# Patient Record
Sex: Male | Born: 1975 | Race: White | Hispanic: No | Marital: Married | Smoking: Never smoker
Health system: Southern US, Community
[De-identification: ages and names within clinical notes are randomized; demographics above are authoritative.]

## PROBLEM LIST (undated history)

## (undated) DIAGNOSIS — E7800 Pure hypercholesterolemia, unspecified: Secondary | ICD-10-CM

## (undated) DIAGNOSIS — K219 Gastro-esophageal reflux disease without esophagitis: Secondary | ICD-10-CM

---

## 2011-12-28 ENCOUNTER — Emergency Department (HOSPITAL_BASED_OUTPATIENT_CLINIC_OR_DEPARTMENT_OTHER): Payer: Self-pay | Attending: Emergency Medicine

## 2011-12-28 ENCOUNTER — Emergency Department (HOSPITAL_BASED_OUTPATIENT_CLINIC_OR_DEPARTMENT_OTHER): Payer: Self-pay

## 2011-12-28 ENCOUNTER — Encounter (HOSPITAL_BASED_OUTPATIENT_CLINIC_OR_DEPARTMENT_OTHER): Payer: Self-pay | Admitting: Emergency Medicine

## 2011-12-28 ENCOUNTER — Emergency Department (HOSPITAL_BASED_OUTPATIENT_CLINIC_OR_DEPARTMENT_OTHER)
Admission: EM | Admit: 2011-12-28 | Discharge: 2011-12-28 | Disposition: A | Discharge status: Home / Self Care | Payer: Self-pay | Source: Home / Self Care | Attending: Emergency Medicine | Admitting: Emergency Medicine

## 2011-12-28 DIAGNOSIS — E7800 Pure hypercholesterolemia, unspecified: Secondary | ICD-10-CM | POA: Insufficient documentation

## 2011-12-28 DIAGNOSIS — W1789XA Other fall from one level to another, initial encounter: Secondary | ICD-10-CM | POA: Insufficient documentation

## 2011-12-28 DIAGNOSIS — K219 Gastro-esophageal reflux disease without esophagitis: Secondary | ICD-10-CM | POA: Insufficient documentation

## 2011-12-28 DIAGNOSIS — S51009A Unspecified open wound of unspecified elbow, initial encounter: Secondary | ICD-10-CM | POA: Insufficient documentation

## 2011-12-28 DIAGNOSIS — S51019A Laceration without foreign body of unspecified elbow, initial encounter: Secondary | ICD-10-CM

## 2011-12-28 DIAGNOSIS — Y93H3 Activity, building and construction: Secondary | ICD-10-CM | POA: Insufficient documentation

## 2011-12-28 DIAGNOSIS — W19XXXA Unspecified fall, initial encounter: Secondary | ICD-10-CM

## 2011-12-28 HISTORY — DX: Gastro-esophageal reflux disease without esophagitis: K21.9

## 2011-12-28 HISTORY — DX: Pure hypercholesterolemia, unspecified: E78.00

## 2011-12-28 MED ORDER — LIDOCAINE HCL (PF) 1 % IJ SOLN
5.0000 mL | Freq: Once | INTRAMUSCULAR | Status: AC
  Administered 2011-12-28: 5 mL via INTRADERMAL
  Filled 2011-12-28: qty 5

## 2011-12-28 MED ORDER — CEPHALEXIN 500 MG PO CAPS: 500.0000 mg | ORAL_CAPSULE | Freq: Two times a day (BID) | ORAL | Status: AC

## 2011-12-28 MED ORDER — CEPHALEXIN 250 MG PO CAPS
500.0000 mg | ORAL_CAPSULE | Freq: Once | ORAL | Status: AC
  Administered 2011-12-28: 500 mg via ORAL
  Filled 2011-12-28 (×2): qty 1

## 2011-12-28 NOTE — ED Notes (Signed)
Pt states he fell aproximmately 9 ft. He denied head injuries no loc. Complaints of bilateral knee pain, right elbow pain, aroximately 1" open laceration on posterior right elbow and no active bleeding. MD on bed side for exam.

## 2011-12-28 NOTE — ED Provider Notes (Signed)
History     CSN: [id number]  Arrival date & time 12/28/11  0802   First MD Initiated Contact with Patient 12/28/11 [phone]      Chief Complaint  Patient presents with  . Fall    (Consider location/radiation/quality/duration/timing/severity/associated sxs/prior treatment) HPI The patient presents immediately after a fall.  He is a Corporate investment banker.  He fell from a wall approximately 8 feet high.  He landed predominantly on his right shoulder, and struck his knees and left back.  He denies any loss of consciousness, confusion, visual changes, nausea, vomiting, disorientation. Since the event he said pain focally in the aforementioned areas.  The pain is worse with motion.  No attempts at relief thus far. The patient's last tetanus shot was 5 years ago.  Past Medical History  Diagnosis Date  . Hypercholesteremia   . GERD (gastroesophageal reflux disease)     History reviewed. No pertinent past surgical history.  History reviewed. No pertinent family history.  History  Substance Use Topics  . Smoking status: Never Smoker   . Smokeless tobacco: Not on file  . Alcohol Use: No      Review of Systems  Constitutional:       Per HPI, otherwise negative  HENT:       Per HPI, otherwise negative  Eyes: Negative.   Respiratory:       Per HPI, otherwise negative  Cardiovascular:       Per HPI, otherwise negative  Gastrointestinal: Negative for vomiting.  Genitourinary: Negative.   Musculoskeletal:       Per HPI, otherwise negative  Skin: Negative.   Neurological: Negative for syncope.    Allergies  Alka-seltzer and Aspirin  Home Medications   Current Outpatient Rx  Name Route Sig Dispense Refill  . CEPHALEXIN 500 MG PO CAPS Oral Take 1 capsule (500 mg total) by mouth 2 (two) times daily. 14 capsule 0  . OMEGA-3 FATTY ACIDS 1000 MG PO CAPS Oral Take 2 g by mouth daily.    Marland Kitchen OMEPRAZOLE 20 MG PO CPDR Oral Take 20 mg by mouth daily.      BP 107/71  Pulse 60  Temp  97.8 F (36.6 C) (Oral)  Resp 18  SpO2 100%  Physical Exam  Nursing note and vitals reviewed. Constitutional: He is oriented to person, place, and time. He appears well-developed. No distress.  HENT:  Head: Normocephalic and atraumatic.  Eyes: Conjunctivae and EOM are normal.  Neck: Full passive range of motion without pain. No spinous process tenderness and no muscular tenderness present.  Cardiovascular: Normal rate and regular rhythm.   Pulmonary/Chest: Effort normal. No stridor. No respiratory distress.  Abdominal: He exhibits no distension.  Musculoskeletal: He exhibits no edema.       Back:       Arms:      Legs: Neurological: He is alert and oriented to person, place, and time.  Skin: Skin is warm and dry.  Psychiatric: He has a normal mood and affect.    ED Course  LACERATION REPAIR Date/Time: 12/28/2011 9:30 AM Performed by: Gerhard Munch Authorized by: Gerhard Munch Consent: Verbal consent obtained. Written consent not obtained. The procedure was performed in an emergent situation. Risks and benefits: risks, benefits and alternatives were discussed Consent given by: patient Patient identity confirmed: verbally with patient Time out: Immediately prior to procedure a "time out" was called to verify the correct patient, procedure, equipment, support staff and site/side marked as required. Body area: upper extremity Location details:  right elbow Laceration length: 7 cm Tendon involvement: none Nerve involvement: none Vascular damage: no Anesthesia: local infiltration Local anesthetic: lidocaine 1% with epinephrine Anesthetic total: 5 ml Patient sedated: no Preparation: Patient was prepped and draped in the usual sterile fashion. Irrigation solution: saline Irrigation method: syringe Amount of cleaning: standard Debridement: none Skin closure: 4-0 nylon Number of sutures: 4 Technique: simple Approximation: close Approximation difficulty:  simple Dressing: 4x4 sterile gauze Patient tolerance: Patient tolerated the procedure well with no immediate complications.   (including critical care time)  Labs Reviewed - No data to display Dg Lumbar Spine Complete  12/28/2011  *RADIOLOGY REPORT*  Clinical Data: Left-sided low back pain.  LUMBAR SPINE - COMPLETE 4+ VIEW  Comparison: None.  Findings: Five lumbar type vertebral bodies.  Vertebral body height preserved.  The alignment is anatomic.  L4-L5 and L5-S1 spondylosis is present with loss of disc height and small marginal osteophytes. Mild degenerative disc disease at L2-L3 and L3-L4. There are no pars defects identified.  IMPRESSION: Mild lumbar spondylosis.  No acute abnormality.   Original Report Authenticated By: Andreas Newport, M.D.    Dg Clavicle Right  12/28/2011  *RADIOLOGY REPORT*  Clinical Data: Fall with pain.  RIGHT CLAVICLE - 2+ VIEWS  Comparison: None.  Findings: No fracture or dislocation.  Mild degenerative changes in the right acromioclavicular joint.  Visualized portion of the right chest is unremarkable.  IMPRESSION: No acute findings.  Minimal right acromioclavicular joint osteoarthritis.   Original Report Authenticated By: Reyes Ivan, M.D.    Dg Knee Complete 4 Views Left  12/28/2011  *RADIOLOGY REPORT*  Clinical Data: Fall with pain.  LEFT KNEE - COMPLETE 4+ VIEW  Comparison: None.  Findings: No joint effusion or fracture.  Minimal osteophytosis in the medial and patellofemoral compartments.  Probable faint chondrocalcinosis in the medial and lateral compartments.  IMPRESSION:  1.  No acute findings. 2.  Minimal degenerative change.   Original Report Authenticated By: Reyes Ivan, M.D.      1. Fall   2. Laceration of elbow       MDM  This generally well male presents after a mechanical fall.  The denial of any loss of consciousness, neurologic complaints or neurologic findings his reassuring for the low suspicion of intracranial pathology.  The  patient's multiple areas of pain were evaluated with x-ray, which were negative.  The patient did have a laceration that required repair.  The patient tolerated this well, was discharged in stable condition with antibiotics to to the depth of the laceration.   Gerhard Munch, MD 12/28/11 1001

## 2012-01-03 ENCOUNTER — Encounter (HOSPITAL_BASED_OUTPATIENT_CLINIC_OR_DEPARTMENT_OTHER): Payer: Self-pay | Admitting: *Deleted

## 2012-01-03 ENCOUNTER — Emergency Department (HOSPITAL_BASED_OUTPATIENT_CLINIC_OR_DEPARTMENT_OTHER)
Admission: EM | Admit: 2012-01-03 | Discharge: 2012-01-03 | Disposition: A | Discharge status: Home / Self Care | Payer: Self-pay | Attending: Emergency Medicine | Admitting: Emergency Medicine

## 2012-01-03 DIAGNOSIS — E7800 Pure hypercholesterolemia, unspecified: Secondary | ICD-10-CM | POA: Insufficient documentation

## 2012-01-03 DIAGNOSIS — Z4802 Encounter for removal of sutures: Secondary | ICD-10-CM | POA: Insufficient documentation

## 2012-01-03 DIAGNOSIS — K219 Gastro-esophageal reflux disease without esophagitis: Secondary | ICD-10-CM | POA: Insufficient documentation

## 2012-01-03 NOTE — ED Notes (Signed)
Sutures x 4 right elbow. Well healed. No complaints.

## 2012-01-03 NOTE — ED Provider Notes (Signed)
History     CSN: [id number]  Arrival date & time 01/03/12  1122   First MD Initiated Contact with Patient 01/03/12 1148      Chief Complaint  Patient presents with  . Suture / Staple Removal    (Consider location/radiation/quality/duration/timing/severity/associated sxs/prior treatment) HPI Patient is a 47 neural male status post suture placement for right elbow laceration on 8/28.  Patient has been well with no discharge from the wound. Patient is here for suture removal today. He has no complaints and no pain. Past Medical History  Diagnosis Date  . Hypercholesteremia   . GERD (gastroesophageal reflux disease)     History reviewed. No pertinent past surgical history.  History reviewed. No pertinent family history.  History  Substance Use Topics  . Smoking status: Never Smoker   . Smokeless tobacco: Not on file  . Alcohol Use: No      Review of Systems  Skin: Positive for wound.       Well-healed laceration.  All other systems reviewed and are negative.    Allergies  Alka-seltzer and Aspirin  Home Medications   Current Outpatient Rx  Name Route Sig Dispense Refill  . CEPHALEXIN 500 MG PO CAPS Oral Take 1 capsule (500 mg total) by mouth 2 (two) times daily. 14 capsule 0  . OMEGA-3 FATTY ACIDS 1000 MG PO CAPS Oral Take 2 g by mouth daily.    Marland Kitchen OMEPRAZOLE 20 MG PO CPDR Oral Take 20 mg by mouth daily.      BP 121/75  Pulse 64  Temp 98.1 F (36.7 C) (Oral)  Resp 20  SpO2 100%  Physical Exam  Nursing note and vitals reviewed. GEN: Well-developed, well-nourished male in no distress HEENT: Atraumatic, normocephalic.  EYES: PERRLA BL, no scleral icterus. NECK: Trachea midline, no meningismus Neuro: cranial nerves grossly 2-12 intact, no abnormalities of strength or sensation, A and O x 3 MSK: Patient moves all 4 extremities symmetrically, no deformity, edema, or injury noted Skin: Well-healed right elbow laceration with 4 Sutures in place. No discharge  noted. Psych: no abnormality of mood   ED Course  Procedures (including critical care time)  SUTURE REMOVAL Performed by: Cyndra Numbers  Consent: Verbal consent obtained. Patient identity confirmed: provided demographic data Time out: Immediately prior to procedure a "time out" was called to verify the correct patient, procedure, equipment, support staff and site/side marked as required.  Location details: Right elbow  Wound Appearance: clean  Sutures/Staples Removed: For simple interrupted   Facility: sutures placed in this facility Patient tolerance: Patient tolerated the procedure well with no immediate complications.    Labs Reviewed - No data to display No results found.   1. Visit for suture removal       MDM  Patient was seen today with no complications from prior wound repair. Sutures were removed. Patient was discharged in good condition.        Cyndra Numbers, MD 01/03/12 [phone]

## 2013-08-24 ENCOUNTER — Ambulatory Visit: Payer: Self-pay | Admitting: Family Medicine

## 2013-08-24 ENCOUNTER — Ambulatory Visit: Payer: Self-pay

## 2013-08-24 VITALS — BP 148/90 | HR 94 | Temp 98.1°F | Resp 18 | Ht 65.0 in | Wt 170.6 lb

## 2013-08-24 DIAGNOSIS — M549 Dorsalgia, unspecified: Secondary | ICD-10-CM

## 2013-08-24 DIAGNOSIS — R0789 Other chest pain: Secondary | ICD-10-CM

## 2013-08-24 DIAGNOSIS — M533 Sacrococcygeal disorders, not elsewhere classified: Secondary | ICD-10-CM

## 2013-08-24 DIAGNOSIS — S300XXA Contusion of lower back and pelvis, initial encounter: Secondary | ICD-10-CM

## 2013-08-24 DIAGNOSIS — R071 Chest pain on breathing: Secondary | ICD-10-CM

## 2013-08-24 DIAGNOSIS — S20229A Contusion of unspecified back wall of thorax, initial encounter: Secondary | ICD-10-CM

## 2013-08-24 MED ORDER — MELOXICAM 7.5 MG PO TABS: 7.5000 mg | ORAL_TABLET | Freq: Every day | ORAL | Status: DC

## 2013-08-24 MED ORDER — CYCLOBENZAPRINE HCL 5 MG PO TABS: ORAL_TABLET | ORAL | Status: DC

## 2013-08-24 NOTE — Progress Notes (Addendum)
Subjective:    Patient ID: Marcus Yates, male    DOB: [birth year], 38 y.o.   MRN: [id number]  Back Pain Associated symptoms include chest pain ( TTP) and numbness ( right foot).   Chief Complaint  Patient presents with  . Back Pain    fell off a ladder yesterday at 3pm    This chart was scribed for Meredith Staggers, MD by Andrew Au, ED Scribe. This patient was seen in room 4 and the patient's care was started at 3:49 PM.  HPI Comments: Marcus Yates is a 38 y.o. male who presents to the [facility] complaining of a fall onset one  day. Pt reports that he fell off a ladder while at work and is now complaining of right lower back pain. Pt states that he reported the incident to his employer. 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 Pt reports that he fell 12 ft down and landed on concrete flat on his back while wearing a hard hat. Pt reports that he was wearing a tool belt and a hammer protruded into lower back. Pt reports that during the fall he tried to catch himself and is now complaining of pain to chest. After fall pt did not feel pain initially but began feeling pain last night. Pt reports numbness in right foot after fall but denies feeling numbness at this moment. He also reports bruise to back of head. Pt denies bladder incontinence. lower number in right foot but doesn not have numbness currtent He reports bruise .pt reports pain in the front of   History reviewed. No pertinent past medical history. No Known Allergies Prior to Admission medications   Not on File    Review of Systems  Cardiovascular: Positive for chest pain ( TTP).  Genitourinary: Negative for difficulty urinating.  Musculoskeletal: Positive for back pain.  Neurological: Positive for numbness ( right foot).       Objective:   Physical Exam  Constitutional: He appears well-developed and well-nourished. No distress.  Neck:  Neck- c-spine full ROM no bony tendernes    Musculoskeletal:  Chest wall- tenderness to left Gardiner joint to proximal clavicle no apparent deformity proximal 3rd of clavicle to pectoralis muscle. Shoulder- Superficial abrasion to left posterior shoulder no other wounds noted Full ROM at shoulders. t spine-nontender Tenderness to L4 Ll5 midline bony tenderness to SI joint.  Tender to lower sacrum   Neurological: He displays no Babinski's sign on the right side. He displays no Babinski's sign on the left side.  Reflex Scores:      Patellar reflexes are 2+ on the right side and 2+ on the left side.      Achilles reflexes are 2+ on the right side and 2+ on the left side. Able to heel toe walk guarded flexion to about 80 degrees Extension intact Pain with right lateral flexion Intact left lateral flexion Rotation intact     [facility] reading (PRIMARY) by  Dr.[place] Kretz - LS spine, coccyx.: min anterior spurring lower lumbar, suspected coccyx fx.  L clavicle: no apparent fx.      Assessment & Plan:   [name] is a 38 y.o. male Back pain - Plan: DG Clavicle Left, DG Lumbar Spine 2-3 Views, DG Sacrum/Coccyx, meloxicam (MOBIC) 7.5 MG tablet, cyclobenzaprine (FLEXERIL) 5 MG tablet, Coccydynia - Plan: meloxicam (MOBIC) 7.5 MG tablet  -lumbar contusion with possible coccygeal injury/fx.  Sx care, flexeril Q8h prn, mobic qd prn. Recheck in  5 days.   Left-sided chest wall pain - Plan: meloxicam (MOBIC) 7.5 MG tablet  -strain with catching self on fall. Slight [place] ttp without sts, or crepitance. Sx care, avoid lifting, and recehck in 5days. May need MR or CT for further eval. Rtc/er precautions.  Contusion of lower back - Plan: meloxicam (MOBIC) 7.5 MG tablet, cyclobenzaprine (FLEXERIL) 5 MG tablet - as above.   Language barrier - interpreter present. Understanding expressed.    Meds ordered this encounter  Medications  . meloxicam (MOBIC) 7.5 MG tablet    Sig: Take 1 tablet (7.5 mg total) by mouth daily.    Dispense:  30 tablet     Refill:  0  . cyclobenzaprine (FLEXERIL) 5 MG tablet    Sig: 1 pill by mouth up to every 8 hours as needed. Start with one pill by mouth each bedtime as needed due to sedation    Dispense:  15 tablet    Refill:  0   Patient Instructions  Out of work until recheck in 5 days would be best. Ok to move gently throughout the day (do not stay in bed as this makes spasm worse).   mobic - 1-2 per day as needed for pain (do not combine with other over the counter pain relievers), flexeril up to every 8 hours for muscle spasm. Ice or heat to affected area as needed.  Sit on cushioned ring or other cushion to lessen pain at tailbone. Can recheck back and collarbone area in 5 days - may need other tests or orthopaedic doctor referral.  Return to the clinic or go to the nearest emergency room if any of your symptoms worsen or new symptoms occur.  Dolor de espalda en el adulto (Back Pain, Adult)  El dolor de cintura es frecuente. Aproximadamente 1 de cada 5 personas lo sufren.La causa rara vez pone en peligro la vida. Con frecuencia mejora luego de algn tiempo.Alrededor de la mitad de las personas que sufren un inicio sbito de dolor de cintura, se sentirn mejor luego de 2 semanas. Aproximadamente 8 de cada 10 se sentirn mejor luego de 6 semanas.  CAUSAS  Algunas causas comunes son:   Distensin de los msculos o ligamentos que sostienen la columna vertebral.  Desgaste (degeneracin) de los discos vertebrales.  Artritis.  Traumatismos directos en la espalda. DIAGNSTICO  La mayor parte de las veces, la causa directa no se conoce.Sin embargo, Chief Technology Officerel dolor puede tratarse efectivamente an cuando no se Best boyconozca la causa.Una de las formas ms precisas de asegurar que la causa del dolor no constituye un peligro es responder a las preguntas del mdico acerca de su salud y sus sntomas. Si el mdico necesita ms informacin, podr indicar anlisis de laboratorio o Education officer, environmentalrealizar un diagnstico por imgenes  (radiografas o Health visitorresonancia magntica).Sin embargo, aunque las Hovnanian Enterprisesimgenes muestren modificaciones, generalmente no es necesaria la [place].  INSTRUCCIONES PARA EL CUIDADO EN EL HOGAR  En algunas personas, el dolor de espalda vuelve.Como rara vez es peligroso, los pacientes pueden aprender a Interior and spatial designermanejarlo ellos mismos.   Mantngase activo. Si permanece sentado o de pie mucho tiempo en el mismo lugar, se tensiona la espalda.  No se siente, maneje ni se quede parado en un mismo lugar por ms de 30 minutos. Realice caminatas cortas en superficies planas ni bien el dolor haya cedido. Trate de Copyaumentar cada da el tiempo que camina .  No se quede en la cama.Si hace reposo durante ms de 1 o 2 das, puede Estate agentretrasar la recuperacin.  No evite los ejercicios ni el trabajo.El cuerpo est hecho para moverse.No es peligroso estar [place], aunque le duela la espalda.La espalda se curar ms rpido si contina sus actividades antes de que el dolor se vaya.  Preste atencin a su cuerpo cuando se incline y se levante. Muchas personas sienten menos molestias cuando levantan objetos si doblan las rodillas, mantienen la carga cerca del cuerpo y evitan torcerse. Generalmente, las posiciones ms cmodas son las que ejercen menos tensin en la espalda en recuperacin.  Encuentre una posicin cmoda para dormir. Utilice un colchn firme y recustese de [place]. Doble ligeramente sus rodillas. Si se recuesta sobre su espalda, coloque una almohada debajo de sus rodillas.  Tome slo medicamentos de venta libre o recetados, segn las indicaciones del mdico. Los medicamentos de venta libre para Primary school teacher y reducir Futures trader, son los que en general ms ayudan.El mdico podr prescribirle relajantes musculares.Estos medicamentos calman el dolor de modo que pueda retornar a sus actividades normales y a Investment banker, operational.  Aplique hielo sobre la zona lesionada.  Ponga el hielo en una bolsa  plstica.  Colquese una toalla entre la piel y la bolsa de hielo.  Deje la bolsa de hielo durante 15 a 20 minutos 3 a 4 veces por da, durante los primeros 2  3 das. Luego podr alternar Eusebio Me calor y hielo para reducir Chief Technology Officer y los espasmos.  Consulte a su mdico si puede tratar de hacer ejercicios para la espalda y recibir un masaje suave. Pueden ser beneficiosos.  Evite sentirse ansioso o estresado.El estrs aumenta la tensin muscular y puede empeorar el dolor de espalda.Es importante reconocer cuando est ansioso o estresado y aprender la forma de controlarlos.El ejercicio es una gran opcin. SOLICITE ATENCIN MDICA SI:   Siente un dolor que no se alivia con reposo o medicamentos.  El dolor no mejora en 1 semana.  Desarrolla nuevos sntomas.  No se siente bien en general. SOLICITE ATENCIN MDICA DE INMEDIATO SI:  Siente un dolor que se irradia desde la espalda hacia sus piernas.  Desarrolla nuevos problemas en el intestino o la vejiga.  Siente debilidad o adormecimiento inusual en sus brazos o piernas.  Presenta nuseas o vmitos.  Presenta dolor abdominal.  Se siente desfalleciente. Document Released: 04/18/2005 Document Revised: 10/18/2011 [facility] Patient Information 2014 [place], [place]. Dolor de la pared torcica (Chest Wall Pain) Dolor en la pared torcica es dolor en o alrededor de los huesos y msculos de su pecho. Podrn pasar hasta 6 semanas hasta que comience a mejorar. Puede demorar ms tiempo si es fsicamente activo en su Aleen Campi y [place].  CAUSAS  El dolor en el pecho puede aparecer sin motivo. No obstante, algunas causas pueden ser:   Neomia Dear enfermedad viral como la gripe.  Traumatismos.  Tos.  La prctica de ejercicios.  Artritis.  Fibromialgia  [place]. INSTRUCCIONES PARA EL CUIDADO DOMICILIARIO  Evite hacer actividad fsica extenuante. Trate de no esforzarse o Electrical engineer. Aqu se incluyen las  actividades en las que [place] los msculos del trax, los abdominales y los msculos laterales, especialmente si debe levantar objetos pesados.  Aplique hielo sobre la zona dolorida.  Ponga el hielo en una bolsa plstica.  Colquese una toalla entre la piel y la bolsa de hielo.  Deje la bolsa de hielo durante 15 a 20 minutos por hora, durante los primeros 2 [address]  [address].  Utilice los medicamentos de venta libre o de prescripcin para Chief Technology Officer, Environmental health practitioner o la  fiebre, segn se lo indique el profesional que lo asiste. SOLICITE ATENCIN MDICA DE INMEDIATO SI:  El dolor aumenta o siente muchas molestias.  Tiene fiebre.  El dolor de [place] Gatepecho empeora.  Desarrolla nuevos e inexplicables sntomas.  Tiene nuseas o vmitos.  Berenice Primasranspira o se siente mareado.  Tiene tos con flema (esputo), o tose con sangre. EST SEGURO QUE:   Comprende las instrucciones para el alta mdica.  Controlar su enfermedad.  Solicitar atencin mdica de inmediato segn las indicaciones. Document Released: 05/30/2006 Document Revised: 07/11/2011 [facility] Patient Information 2014 [place], [place]. Traumatismo del cxis  (Tailbone Injury)  El cxis es el hueso pequeo que se encuentra en el extremo inferior de la columna vertebral. Una lesin en el cxis puede implicar la distensin de los tejidos, hematomas o un hueso roto (fracture). Las mujeres son ms vulnerables debido a que su pelvis es ms ancha.  CAUSAS  Este tipo de lesin generalmente se produce al caer sobre coxis. La tensin o la friccin repetida por Cablevision Systemsactividades como el remo y el ciclismo tambin puede lesionar la zona. El cccix puede lesionarse durante el Falcon Lake Estatesparto. Las infecciones o tumores tambin pueden ejercer presin en el coxis y Programmer, multimediacausar dolor. A veces, la causa es desconocida.  SNTOMAS   Hematomas.  Dolor al sentarse.  Movimientos intestinales dolorosos.  En las mujeres, dolor durante las The St. Paul Travelersrelaciones sexuales. DIAGNSTICO  El mdico podr hacer el  diagnstico basndose en los sntomas y el examen fsico Podrn tomarle una radiografa si se sospecha una fractura. Tambin podr indicarle una resonancia magntica para evaluar los sntomas.  TRATAMIENTO  El mdico podr prescribir algunos medicamentos para Primary school teachercalmar el dolor. La mayora de las lesiones en el coxis mejoran por s mismas despus de 4 a 6 semanas. Sin embargo, si la causa es una infeccin o un tumor, el tiempo de Paediatric nurserecuperacin podr variar.  PREVENCIN  Use protectores y equipo deportivo adecuados , cuando practique ciclismo y remo. Esto puede ayudar a prevenir una lesin por tensin repetida o friccin.  INSTRUCCIONES PARA EL CUIDADO DOMICILIARIO  Aplique hielo sobre la zona lesionada.  Ponga el hielo en una bolsa plstica.  Colquese una toalla entre la piel y la bolsa de hielo.  Deje la bolsa de hielo durante 15 a 20minutos 3 a 4 veces por da, durante los primeros 1  2 das.  Si se sienta sobre un aro de goma o inflable o sobre un almohadn podr Engineer, materialsaliviar el dolor. Inclnese hacia adelante al sentarse para disminuir las Federal-Mogulmolestias.  Evite estar sentado durante largos perodos.  Aumente la actividad cuando el dolor se lo permita.  Utilice los medicamentos de venta libre o de prescripcin para Chief Technology Officerel dolor, Environmental health practitionerel malestar o la [place] Hillfiebre, segn se lo indique el profesional que lo asiste.  Puede usar laxantes si siente dolor al mover el intestino, o segn las indicaciones del mdico.  Consuma una dieta rica en fibra para prevenir la constipacin.  Cumpla con todas las visitas de control, segn le indique su mdico. SOLICITE ATENCIN MDICA SI:  El dolor empeora.  Al mover el intestino siente una molestia intensa.  No puede mover el intestino.  Tiene fiebre. ASEGRESE DE QUE:   Comprende estas instrucciones.  Controlar su enfermedad.  Solicitar ayuda de inmediato si no mejora o si empeora. Document Released: 01/26/2005 Document Revised: 07/11/2011 [facility] Patient  Information 2014 [place]ExitCare, [place]LL[place].       I personally performed the services described in this documentation, which was scribed in my presence. The recorded information  has been reviewed and considered, and addended by me as needed.

## 2013-08-24 NOTE — Patient Instructions (Addendum)
Out of work until recheck in 5 days would be best. Ok to move gently throughout the day (do not stay in bed as this makes spasm worse).   mobic - 1-2 per day as needed for pain (do not combine with other over the counter pain relievers), flexeril up to every 8 hours for muscle spasm. Ice or heat to affected area as needed.  Sit on cushioned ring or other cushion to lessen pain at tailbone. Can recheck back and collarbone area in 5 days - may need other tests or orthopaedic doctor referral.  Return to the clinic or go to the nearest emergency room if any of your symptoms worsen or new symptoms occur.  Dolor de espalda en el adulto (Back Pain, Adult)  El dolor de cintura es frecuente. Aproximadamente 1 de cada 5 personas lo sufren.La causa rara vez pone en peligro la vida. Con frecuencia mejora luego de algn tiempo.Alrededor de la mitad de las personas que sufren un inicio sbito de dolor de cintura, se sentirn mejor luego de 2 semanas. Aproximadamente 8 de cada 10 se sentirn mejor luego de 6 semanas.  CAUSAS  Algunas causas comunes son:   Distensin de los msculos o ligamentos que sostienen la columna vertebral.  Desgaste (degeneracin) de los discos vertebrales.  Artritis.  Traumatismos directos en la espalda. DIAGNSTICO  La mayor parte de las veces, la causa directa no se conoce.Sin embargo, Chief Technology Officer puede tratarse efectivamente an cuando no se Best boy.Una de las formas ms precisas de asegurar que la causa del dolor no constituye un peligro es responder a las preguntas del mdico acerca de su salud y sus sntomas. Si el mdico necesita ms informacin, podr indicar anlisis de laboratorio o Education officer, environmental un diagnstico por imgenes (radiografas o Health visitor).Sin embargo, aunque las [employer] modificaciones, generalmente no es necesaria la [place].  INSTRUCCIONES PARA EL CUIDADO EN EL HOGAR  En algunas personas, el dolor de espalda vuelve.Como rara vez es  peligroso, los pacientes pueden aprender a Interior and spatial designer.   Mantngase activo. Si permanece sentado o de pie mucho tiempo en el mismo lugar, se tensiona la espalda.  No se siente, maneje ni se quede parado en un mismo lugar por ms de 30 minutos. Realice caminatas cortas en superficies planas ni bien el dolor haya cedido. Trate de Copy tiempo que camina .  No se quede en la cama.Si hace reposo durante ms de 1 o 2 das, puede Estate agent.  No evite los ejercicios ni el trabajo.El cuerpo est hecho para moverse.No es peligroso estar [place], aunque le duela la espalda.La espalda se curar ms rpido si contina sus actividades antes de que el dolor se vaya.  Preste atencin a su cuerpo cuando se incline y se levante. Muchas personas sienten menos molestias cuando levantan objetos si doblan las rodillas, mantienen la carga cerca del cuerpo y evitan torcerse. Generalmente, las posiciones ms cmodas son las que ejercen menos tensin en la espalda en recuperacin.  Encuentre una posicin cmoda para dormir. Utilice un colchn firme y recustese de [place]. Doble ligeramente sus rodillas. Si se recuesta sobre su espalda, coloque una almohada debajo de sus rodillas.  Tome slo medicamentos de venta libre o recetados, segn las indicaciones del mdico. Los medicamentos de venta libre para Primary school teacher y reducir Futures trader, son los que en general ms ayudan.El mdico podr prescribirle relajantes musculares.Estos medicamentos calman el dolor de modo que pueda retornar a sus actividades normales y  a realizar ejercicios saludables.  Aplique hielo sobre la zona lesionada.  Ponga el hielo en una bolsa plstica.  Colquese una toalla entre la piel y la bolsa de hielo.  Deje la bolsa de hielo durante 15 a 20 minutos 3 a 4 veces por da, durante los primeros 2  3 das. Luego podr alternar Eusebio Meentre calor y hielo para reducir Chief Technology Officerel dolor y los espasmos.  Consulte a  su mdico si puede tratar de hacer ejercicios para la espalda y recibir un masaje suave. Pueden ser beneficiosos.  Evite sentirse ansioso o estresado.El estrs aumenta la tensin muscular y puede empeorar el dolor de espalda.Es importante reconocer cuando est ansioso o estresado y aprender la forma de controlarlos.El ejercicio es una gran opcin. SOLICITE ATENCIN MDICA SI:   Siente un dolor que no se alivia con reposo o medicamentos.  El dolor no mejora en 1 semana.  Desarrolla nuevos sntomas.  No se siente bien en general. SOLICITE ATENCIN MDICA DE INMEDIATO SI:  Siente un dolor que se irradia desde la espalda hacia sus piernas.  Desarrolla nuevos problemas en el intestino o la vejiga.  Siente debilidad o adormecimiento inusual en sus brazos o piernas.  Presenta nuseas o vmitos.  Presenta dolor abdominal.  Se siente desfalleciente. Document Released: 04/18/2005 Document Revised: 10/18/2011 [facility] Patient Information 2014 [place]amExitCare, MarylandLLC. Dolor de la pared torcica (Chest Wall Pain) Dolor en la pared torcica es dolor en o alrededor de los huesos y msculos de su pecho. Podrn pasar hasta 6 semanas hasta que comience a mejorar. Puede demorar ms tiempo si es fsicamente activo en su Aleen Campitrabajo y Brandywineactividades.  CAUSAS  El dolor en el pecho puede aparecer sin motivo. No obstante, algunas causas pueden ser:   Neomia DearUna enfermedad viral como la gripe.  Traumatismos.  Tos.  La prctica de ejercicios.  Artritis.  Fibromialgia  [place]. INSTRUCCIONES PARA EL CUIDADO DOMICILIARIO  Evite hacer actividad fsica extenuante. Trate de no esforzarse o Electrical engineerrealizar actividades que le causen dolor. Aqu se incluyen las actividades en las que [place]ausa los msculos del trax, los abdominales y los msculos laterales, especialmente si debe levantar objetos pesados.  Aplique hielo sobre la zona dolorida.  Ponga el hielo en una bolsa plstica.  Colquese una toalla entre la piel y la  bolsa de hielo.  Deje la bolsa de hielo durante 15 a 20 minutos por hora, durante los primeros 2 [address]  [address].  Utilice los medicamentos de venta libre o de prescripcin para Chief Technology Officerel dolor, Environmental health practitionerel malestar o la [place] Olivefiebre, segn se lo indique el profesional que lo asiste. SOLICITE ATENCIN MDICA DE INMEDIATO SI:  El dolor aumenta o siente muchas molestias.  Tiene fiebre.  El dolor de Rock Islandpecho empeora.  Desarrolla nuevos e inexplicables sntomas.  Tiene nuseas o vmitos.  Berenice Primasranspira o se siente mareado.  Tiene tos con flema (esputo), o tose con sangre. EST SEGURO QUE:   Comprende las instrucciones para el alta mdica.  Controlar su enfermedad.  Solicitar atencin mdica de inmediato segn las indicaciones. Document Released: 05/30/2006 Document Revised: 07/11/2011 [facility] CExitCare Patient Information 2014 [place]orgetownExitCare, MarylandLLC. Traumatismo del cxis  (Tailbone Injury)  El cxis es el hueso pequeo que se encuentra en el extremo inferior de la columna vertebral. Una lesin en el cxis puede implicar la distensin de los tejidos, hematomas o un hueso roto (fracture). Las mujeres son ms vulnerables debido a que su pelvis es ms ancha.  CAUSAS  Este tipo de lesin generalmente se produce al caer sobre coxis. La tensin o la friccin  repetida por Cablevision Systemsactividades como el remo y el ciclismo tambin puede lesionar la zona. El cccix puede lesionarse durante el [place]parto. Las infecciones o tumores tambin pueden ejercer presin en el coxis y Programmer, multimediacausar dolor. A veces, la causa es desconocida.  SNTOMAS   Hematomas.  Dolor al sentarse.  Movimientos intestinales dolorosos.  En las mujeres, dolor durante las The St. Paul Travelersrelaciones sexuales. DIAGNSTICO  El mdico podr hacer el diagnstico basndose en los sntomas y el examen fsico Podrn tomarle una radiografa si se sospecha una fractura. Tambin podr indicarle una resonancia magntica para evaluar los sntomas.  TRATAMIENTO  El mdico podr prescribir algunos medicamentos para Software engineercalmar  el dolor. La mayora de las lesiones en el coxis mejoran por s mismas despus de 4 a 6 semanas. Sin embargo, si la causa es una infeccin o un tumor, el tiempo de Paediatric nurserecuperacin podr variar.  PREVENCIN  Use protectores y equipo deportivo adecuados , cuando practique ciclismo y remo. Esto puede ayudar a prevenir una lesin por tensin repetida o friccin.  INSTRUCCIONES PARA EL CUIDADO DOMICILIARIO  Aplique hielo sobre la zona lesionada.  Ponga el hielo en una bolsa plstica.  Colquese una toalla entre la piel y la bolsa de hielo.  Deje la bolsa de hielo durante 15 a 20minutos 3 a 4 veces por da, durante los primeros 1  2 das.  Si se sienta sobre un aro de goma o inflable o sobre un almohadn podr Engineer, materialsaliviar el dolor. Inclnese hacia adelante al sentarse para disminuir las Federal-Mogulmolestias.  Evite estar sentado durante largos perodos.  Aumente la actividad cuando el dolor se lo permita.  Utilice los medicamentos de venta libre o de prescripcin para Chief Technology Officerel dolor, Environmental health practitionerel malestar o la St. Liboryfiebre, segn se lo indique el profesional que lo asiste.  Puede usar laxantes si siente dolor al mover el intestino, o segn las indicaciones del mdico.  Consuma una dieta rica en fibra para prevenir la constipacin.  Cumpla con todas las visitas de control, segn le indique su mdico. SOLICITE ATENCIN MDICA SI:  El dolor empeora.  Al mover el intestino siente una molestia intensa.  No puede mover el intestino.  Tiene fiebre. ASEGRESE DE QUE:   Comprende estas instrucciones.  Controlar su enfermedad.  Solicitar ayuda de inmediato si no mejora o si empeora. Document Released: 01/26/2005 Document Revised: 07/11/2011 [facility]ExitCare Patient Information 2014 [place] PaulExitCare, MarylandLLC.

## 2013-08-29 ENCOUNTER — Ambulatory Visit: Payer: Self-pay | Admitting: Family Medicine

## 2013-08-29 VITALS — BP 110/70 | HR 63 | Temp 98.4°F | Resp 16 | Ht 65.0 in | Wt 170.0 lb

## 2013-08-29 DIAGNOSIS — R0789 Other chest pain: Secondary | ICD-10-CM

## 2013-08-29 DIAGNOSIS — S300XXA Contusion of lower back and pelvis, initial encounter: Secondary | ICD-10-CM

## 2013-08-29 DIAGNOSIS — M549 Dorsalgia, unspecified: Secondary | ICD-10-CM

## 2013-08-29 DIAGNOSIS — M533 Sacrococcygeal disorders, not elsewhere classified: Secondary | ICD-10-CM

## 2013-08-29 DIAGNOSIS — R071 Chest pain on breathing: Secondary | ICD-10-CM

## 2013-08-29 DIAGNOSIS — R319 Hematuria, unspecified: Secondary | ICD-10-CM

## 2013-08-29 DIAGNOSIS — S20229A Contusion of unspecified back wall of thorax, initial encounter: Secondary | ICD-10-CM

## 2013-08-29 LAB — POCT URINALYSIS DIPSTICK
Bilirubin, UA: NEGATIVE
Blood, UA: NEGATIVE
GLUCOSE UA: NEGATIVE
LEUKOCYTES UA: NEGATIVE
NITRITE UA: NEGATIVE
Spec Grav, UA: >=1.03
UROBILINOGEN UA: 0.2
pH, UA: 6

## 2013-08-29 LAB — POCT UA - MICROSCOPIC ONLY
Bacteria, U Microscopic: NEGATIVE
CASTS, UR, LPF, POC: NEGATIVE
Crystals, Ur, HPF, POC: NEGATIVE
YEAST UA: NEGATIVE

## 2013-08-29 MED ORDER — CYCLOBENZAPRINE HCL 5 MG PO TABS: ORAL_TABLET | ORAL | Status: AC

## 2013-08-29 MED ORDER — MELOXICAM 7.5 MG PO TABS: 7.5000 mg | ORAL_TABLET | Freq: Every day | ORAL | Status: DC

## 2013-08-29 NOTE — Patient Instructions (Addendum)
regrese a Passenger transport managertrabajo, pero menos de 10 libros, y minimo trabaja con su espalda. si sangre regrese en su orina, regrese a clinica o cuarto de Associate Professoremergencia.   chequiar en 1 semana. Mas temprano si peorse.  Contusin  (Contusion)  Una contusin es un hematoma interno. Las contusiones son el resultado de un traumatismo que produce un sangrado debajo de la piel. La contusin Clear Channel Communicationspuede volverse azul, prpura o [place] Havenamarilla. Un traumatismo menor ocasionar un hematoma indoloro, pero las contusiones ms importantes pueden doler y Personal assistantpermanecer hinchadas durante varias semanas.  CAUSAS  Generalmente la causa de la contusin es un golpe, un traumatismo o una fuerza directa ejercida en una zona del cuerpo.  SNTOMAS   Hinchazn y enrojecimiento en la zona lesionada.  Hematoma en la zona lesionada.  Sensibilidad e inflamacin en la zona lesionada.  Dolor. DIAGNSTICO  El diagnstico puede hacerse a travs de la historia clnica y el examen fsico. Ser necesaria una radiografa o una tomografa computada para determinar si hay lesiones asociadas, como fracturas.  TRATAMIENTO  El tratamiento especfico depender de qu parte del cuerpo se lesion. En general, el mejor tratamiento para una contusin es el reposo, hielo, elevacin, y la aplicacin de compresas fras en el rea afectada. Tambin se recomiendan los medicamentos de venta libre para el control del dolor. Pregunte a su mdico cul es el mejor tratamiento para su contusin.  INSTRUCCIONES PARA EL CUIDADO EN EL HOGAR   Aplique hielo sobre la zona lesionada.  Ponga el hielo en una bolsa plstica.  Colquese una toalla entre la piel y la bolsa de hielo.  Deje el hielo durante 15 a 20 minutos, 3 a 4 veces por da.  Slo tome medicamentos de venta libre o recetados para Primary school teachercalmar el dolor, las molestias o bajar la fiebre segn las indicaciones de su mdico. El mdico puede indicarle que evite los antiinflamatorios (aspirina, ibuprofeno y naproxeno) durante 48  horas debido a que estos medicamentos pueden aumentar el hematoma.  Haga que la zona lesionada repose.  En lo posible, eleve la zona lesionada para disminuir la hinchazn. SOLICITE ATENCIN MDICA DE INMEDIATO SI:   El hematoma o la hinchazn aumentan.  Siente que Community education officerel dolor empeora.  El dolor o la hinchazn no se alivian con los medicamentos. ASEGRESE DE QUE:   Comprende estas instrucciones.  Controlar su enfermedad.  Solicitar ayuda de inmediato si no mejora o si empeora. Document Released: 01/26/2005 Document Revised: 07/11/2011 [facility]ExitCare Patient Information 2014 [place]burgExitCare, MarylandLLC.
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
## 2013-09-05 ENCOUNTER — Ambulatory Visit: Payer: Self-pay | Admitting: Family Medicine

## 2013-09-05 VITALS — BP 104/62 | HR 64 | Temp 98.0°F | Resp 16 | Ht 65.5 in | Wt 173.0 lb

## 2013-09-05 DIAGNOSIS — Z789 Other specified health status: Secondary | ICD-10-CM

## 2013-09-05 DIAGNOSIS — S300XXA Contusion of lower back and pelvis, initial encounter: Secondary | ICD-10-CM

## 2013-09-05 DIAGNOSIS — Z609 Problem related to social environment, unspecified: Secondary | ICD-10-CM

## 2013-09-05 DIAGNOSIS — S20229A Contusion of unspecified back wall of thorax, initial encounter: Secondary | ICD-10-CM

## 2013-09-05 DIAGNOSIS — M533 Sacrococcygeal disorders, not elsewhere classified: Secondary | ICD-10-CM

## 2013-09-05 NOTE — Progress Notes (Signed)
Subjective:  This chart was scribed for Corning IncorporatedJeffrey R. Neva SeatGreene  by Ashley JacobsBrittany Andrews, [facility] Sc[facility]. The patient was seen in room and the patient's care was started at 4:02 PM.   Patient ID: Marcus Yates, male    DOB: Nov 15, 1975, 38 y.o.   MRN: [id number]  Back Pain   HPI Comments: Marcus Yates is a 38 y.o. male who [facility] to the [facility] for a follow up of lower back pain and minimal pain lower sacral/coccyx region. Pt's initial eval was August 24, 2013, again August 29, 2013. Pt mentioned that his pain has improved since his last visit. He is does not have any bowel incontience,difficulty urinating, or blood in urine. He mentions the pain is 80% better. The pain is worse when he walks uphill but overall the pain is better. Pt has not return to work but he feels well enough to return to work on Monday. Pt is still taking Meloxicam once a day and Flexeril every eight hours as instructed. Still some soreness in tailbone.     There are no active problems to display for this patient.  History reviewed. No pertinent past medical history. History reviewed. No pertinent past surgical history. Allergies  Allergen Reactions  . Aspirin Itching   Prior to Admission medications   Medication Sig Start Date End Date Taking? Authorizing Provider  cyclobenzaprine (FLEXERIL) 5 MG tablet 1 pill by mouth up to every 8 hours as needed. Start with one pill by mouth each bedtime as needed due to sedation 08/29/13  Yes Shade FloodJeffrey R Ilayda Toda, MD  meloxicam (MOBIC) 7.5 MG tablet Take 1 tablet (7.5 mg total) by mouth daily. 08/29/13  Yes Shade FloodJeffrey R Jesusa Stenerson, MD   History   Social History  . Marital Status: Married    Spouse Name: N/A    Number of Children: N/A  . Years of Education: N/A   Occupational History  . Not on file.   Social History Main Topics  . Smoking status: Never Smoker   . Smokeless tobacco: Not on file  . Alcohol Use: No  . Drug Use: No    . Sexual Activity: Yes   Other Topics Concern  . Not on file   Social History Narrative  . No narrative on file     Review of Systems  Gastrointestinal:       No bowel incontinence   Genitourinary: Negative for hematuria and difficulty urinating.       No urinary incontinence   Musculoskeletal: Positive for back pain.       Objective:   Physical Exam  Nursing note and vitals reviewed. Constitutional: He is oriented to person, place, and time. He appears well-developed and well-nourished. No distress.  HENT:  Head: Normocephalic and atraumatic.  Eyes: EOM are normal.  Neck: Neck supple. No tracheal deviation present.  Cardiovascular: Normal rate.   Pulmonary/Chest: Effort normal. No respiratory distress.  Musculoskeletal: Normal range of motion. He exhibits tenderness.  Lumbar spine Skin is intact.  The bruising to his buttock has improved. Minimal faded ecchymosis to the left buttock.  Full ROM of the LS spine Tailbone pain with walking on his heel.   Neurological: He is alert and oriented to person, place, and time.  Skin: Skin is warm and dry.  Psychiatric: He has a normal mood and affect. His behavior is normal.    Filed Vitals:   09/05/13 1437  BP: 104/62  Pulse: 64  Temp: 98  F (36.7 C)  TempSrc: Oral  Resp: 16  Height: 5' 5.5" (1.664 m)  Weight: 173 lb (78.472 kg)  SpO2: 99%       Assessment & Plan:   Marcus Yates is a 38 y.o. male Coccydynia , contusion of lower back - no further hematuria since initial day. Pain much improved, good rom and strength. Would like to try to RTW in 4 days.  Can cont mobic, wean flexeril to qhs. Note given to rtw, but if pain at work or worsening, out 1 more week and recehck.  rtc precautions.   Language barrier- spanish spoken, understanding expressed.    No orders of the defined types were placed in this encounter.   Patient Instructions  Continue meloxicam cada dia y cyclobenzaprine en la noche si  necesario. si dolor es mejor - regrese a Geneticist, moleculartrabaja en Lunes, pero si peorse - halta trabaja por un mas semana.  Regrese a la clinica si peorse.  Traumatismo del cxis  (Tailbone Injury)  El cxis es el hueso pequeo que se encuentra en el extremo inferior de la columna vertebral. Una lesin en el cxis puede implicar la distensin de los tejidos, hematomas o un hueso roto (fracture). Las mujeres son ms vulnerables debido a que su pelvis es ms ancha.  CAUSAS  Este tipo de lesin generalmente se produce al caer sobre coxis. La tensin o la friccin repetida por Cablevision Systemsactividades como el remo y el ciclismo tambin puede lesionar la zona. El cccix puede lesionarse durante el Hyampomparto. Las infecciones o tumores tambin pueden ejercer presin en el coxis y Programmer, multimediacausar dolor. A veces, la causa es desconocida.  SNTOMAS   Hematomas.  Dolor al sentarse.  Movimientos intestinales dolorosos.  En las mujeres, dolor durante las [employer] Travelersrelaciones sexuales. DIAGNSTICO  El mdico podr hacer el diagnstico basndose en los sntomas y el examen fsico Podrn tomarle una radiografa si se sospecha una fractura. Tambin podr indicarle una resonancia magntica para evaluar los sntomas.  TRATAMIENTO  El mdico podr prescribir algunos medicamentos para Primary school teachercalmar el dolor. La mayora de las lesiones en el coxis mejoran por s mismas despus de 4 a 6 semanas. Sin embargo, si la causa es una infeccin o un tumor, el tiempo de Paediatric nurserecuperacin podr variar.  PREVENCIN  Use protectores y equipo deportivo adecuados , cuando practique ciclismo y remo. Esto puede ayudar a prevenir una lesin por tensin repetida o friccin.  INSTRUCCIONES PARA EL CUIDADO DOMICILIARIO  Aplique hielo sobre la zona lesionada.  Ponga el hielo en una bolsa plstica.  Colquese una toalla entre la piel y la bolsa de hielo.  Deje la bolsa de hielo durante 15 a 20minutos 3 a 4 veces por da, durante los primeros 1  2 das.  Si se sienta sobre un aro de goma  o inflable o sobre un almohadn podr Engineer, materialsaliviar el dolor. Inclnese hacia adelante al sentarse para disminuir las Federal-Mogulmolestias.  Evite estar sentado durante largos perodos.  Aumente la actividad cuando el dolor se lo permita.  Utilice los medicamentos de venta libre o de prescripcin para Chief Technology Officerel dolor, Environmental health practitionerel malestar o la [place] Heightsfiebre, segn se lo indique el profesional que lo asiste.  Puede usar laxantes si siente dolor al mover el intestino, o segn las indicaciones del mdico.  Consuma una dieta rica en fibra para prevenir la constipacin.  Cumpla con todas las visitas de control, segn le indique su mdico. SOLICITE ATENCIN MDICA SI:  El dolor empeora.  Al mover el intestino siente Tonia Ghentuna molestia  intensa.  No puede mover el intestino.  Tiene fiebre. ASEGRESE DE QUE:   Comprende estas instrucciones.  Controlar su enfermedad.  Solicitar ayuda de inmediato si no mejora o si empeora. Document Released: 01/26/2005 Document Revised: 07/11/2011 [facility] Patient Information 2014 [place], [place].       I personally performed the services described in this documentation, which was scribed in my presence. The recorded information has been reviewed and considered, and addended by me as needed.

## 2013-09-05 NOTE — Patient Instructions (Signed)
Continue meloxicam cada dia y cyclobenzaprine en la noche si necesario. si dolor es mejor - regrese a Geneticist, moleculartrabaja en Lunes, pero si peorse - halta trabaja por un mas semana.  Regrese a la clinica si peorse.  Traumatismo del cxis  (Tailbone Injury)  El cxis es el hueso pequeo que se encuentra en el extremo inferior de la columna vertebral. Una lesin en el cxis puede implicar la distensin de los tejidos, hematomas o un hueso roto (fracture). Las mujeres son ms vulnerables debido a que su pelvis es ms ancha.  CAUSAS  Este tipo de lesin generalmente se produce al caer sobre coxis. La tensin o la friccin repetida por Cablevision Systemsactividades como el remo y el ciclismo tambin puede lesionar la zona. El cccix puede lesionarse durante el [place]art[place]. Las infecciones o tumores tambin pueden ejercer presin en el coxis y Programmer, multimediacausar dolor. A veces, la causa es desconocida.  SNTOMAS   Hematomas.  Dolor al sentarse.  Movimientos intestinales dolorosos.  En las mujeres, dolor durante las [employer] Travelersrelaciones sexuales. DIAGNSTICO  El mdico podr hacer el diagnstico basndose en los sntomas y el examen fsico Podrn tomarle una radiografa si se sospecha una fractura. Tambin podr indicarle una resonancia magntica para evaluar los sntomas.  TRATAMIENTO  El mdico podr prescribir algunos medicamentos para Primary school teachercalmar el dolor. La mayora de las lesiones en el coxis mejoran por s mismas despus de 4 a 6 semanas. Sin embargo, si la causa es una infeccin o un tumor, el tiempo de Paediatric nurserecuperacin podr variar.  PREVENCIN  Use protectores y equipo deportivo adecuados , cuando practique ciclismo y remo. Esto puede ayudar a prevenir una lesin por tensin repetida o friccin.  INSTRUCCIONES PARA EL CUIDADO DOMICILIARIO  Aplique hielo sobre la zona lesionada.  Ponga el hielo en una bolsa plstica.  Colquese una toalla entre la piel y la bolsa de hielo.  Deje la bolsa de hielo durante 15 a 20minutos 3 a 4 veces por da, durante  los primeros 1  2 das.  Si se sienta sobre un aro de goma o inflable o sobre un almohadn podr Engineer, materialsaliviar el dolor. Inclnese hacia adelante al sentarse para disminuir las Federal-Mogulmolestias.  Evite estar sentado durante largos perodos.  Aumente la actividad cuando el dolor se lo permita.  Utilice los medicamentos de venta libre o de prescripcin para Chief Technology Officerel dolor, Environmental health practitionerel malestar o la Eurekafiebre, segn se lo indique el profesional que lo asiste.  Puede usar laxantes si siente dolor al mover el intestino, o segn las indicaciones del mdico.  Consuma una dieta rica en fibra para prevenir la constipacin.  Cumpla con todas las visitas de control, segn le indique su mdico. SOLICITE ATENCIN MDICA SI:  El dolor empeora.  Al mover el intestino siente una molestia intensa.  No puede mover el intestino.  Tiene fiebre. ASEGRESE DE QUE:   Comprende estas instrucciones.  Controlar su enfermedad.  Solicitar ayuda de inmediato si no mejora o si empeora. Document Released: 01/26/2005 Document Revised: 07/11/2011  [facility] Patient Information 2014 [place], [place].

## 2013-10-02 ENCOUNTER — Other Ambulatory Visit: Payer: Self-pay | Admitting: Family Medicine

## 2014-08-26 IMAGING — CR DG CLAVICLE*L*
2 series · 2 of 2 positions shown · non-contrast
Comparison: None.

CLINICAL DATA: Pain.

EXAM:
LEFT CLAVICLE - 2+ VIEWS

[AP]
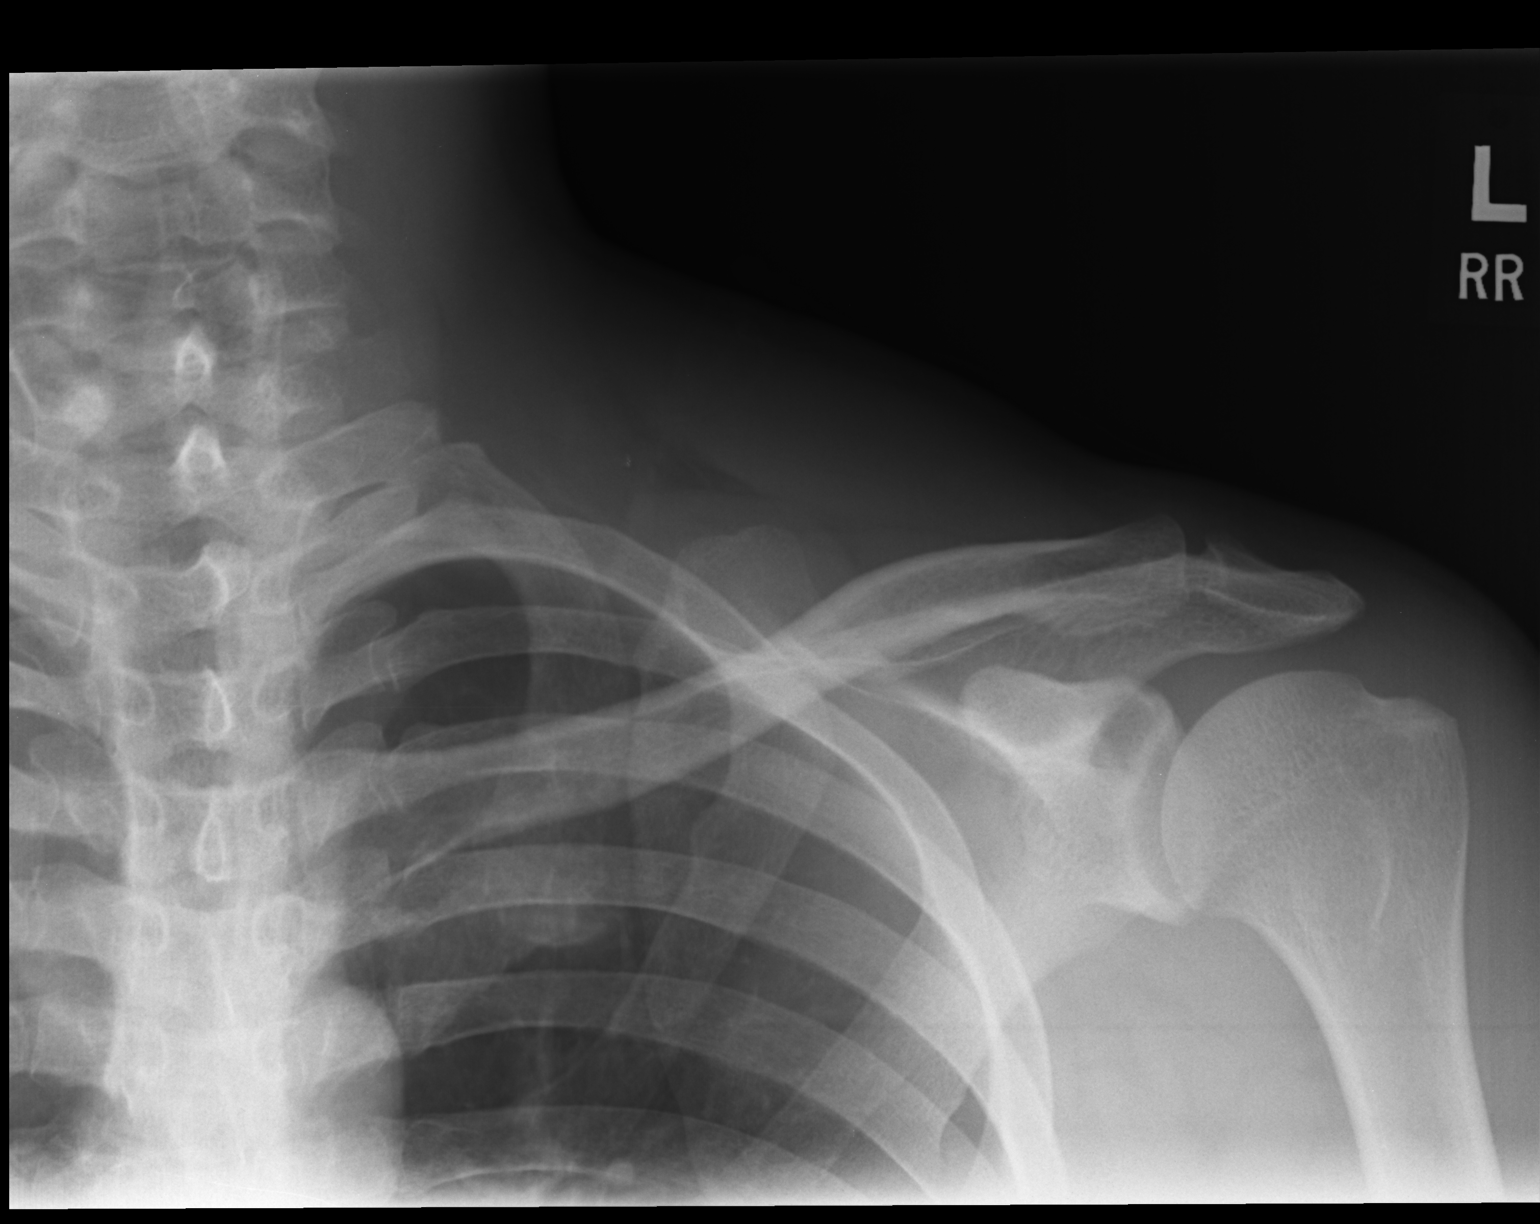

[ap axial]
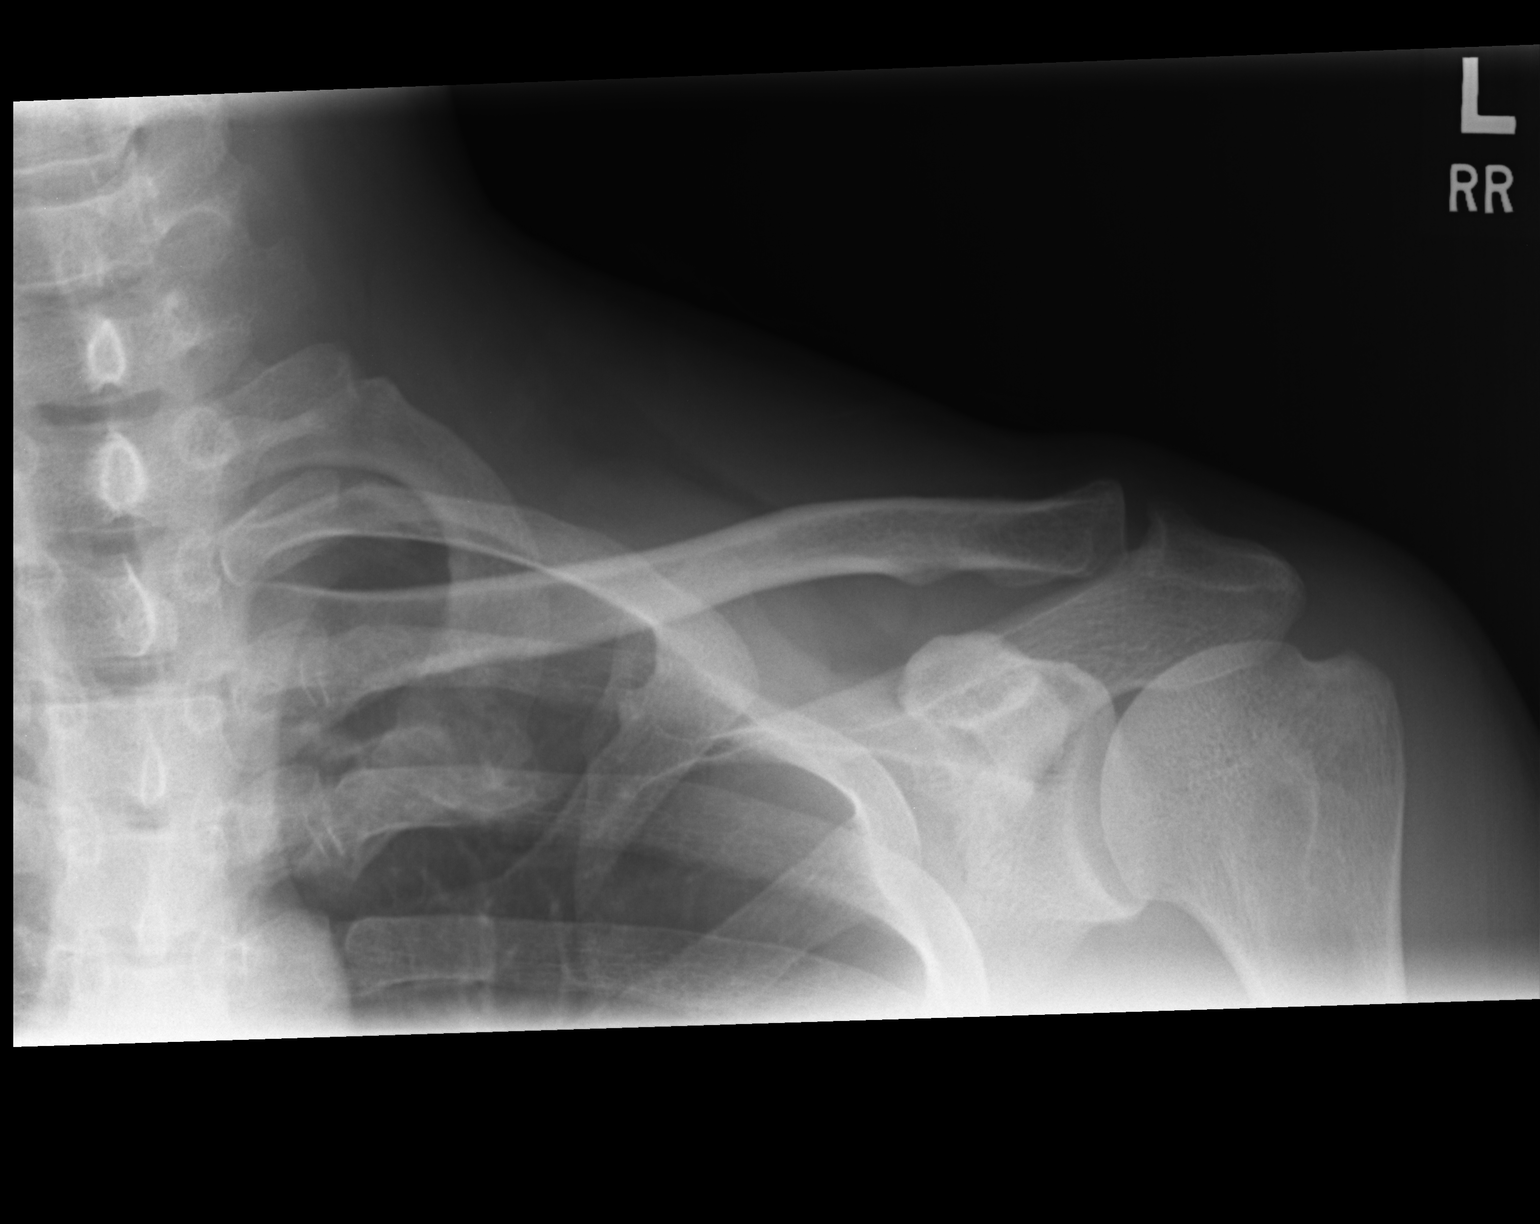

[2 of 2 positions shown; findings below may reference images not displayed]

FINDINGS: Imaged bones, joints and soft tissues appear normal.
IMPRESSION: Negative exam.
